# Patient Record
Sex: Male | Born: 1995 | Race: White | Hispanic: No | Marital: Married
Health system: Southern US, Community
[De-identification: ages and names within clinical notes are randomized; demographics above are authoritative.]

## PROBLEM LIST (undated history)

## (undated) DIAGNOSIS — N200 Calculus of kidney: Secondary | ICD-10-CM

---

## 2019-06-26 ENCOUNTER — Emergency Department (HOSPITAL_COMMUNITY)

## 2019-06-26 ENCOUNTER — Emergency Department (HOSPITAL_COMMUNITY)
Admission: EM | Admit: 2019-06-26 | Discharge: 2019-06-27 | Disposition: A | Discharge status: Home / Self Care | Attending: Emergency Medicine | Admitting: Emergency Medicine

## 2019-06-26 ENCOUNTER — Encounter (HOSPITAL_COMMUNITY): Payer: Self-pay | Admitting: Emergency Medicine

## 2019-06-26 ENCOUNTER — Other Ambulatory Visit: Payer: Self-pay

## 2019-06-26 DIAGNOSIS — R1032 Left lower quadrant pain: Secondary | ICD-10-CM | POA: Diagnosis present

## 2019-06-26 DIAGNOSIS — R109 Unspecified abdominal pain: Secondary | ICD-10-CM

## 2019-06-26 DIAGNOSIS — M545 Low back pain: Secondary | ICD-10-CM | POA: Insufficient documentation

## 2019-06-26 LAB — CBC WITH DIFFERENTIAL/PLATELET
Abs Immature Granulocytes: 0.03 10*3/uL (ref 0.00–0.07)
Basophils Absolute: 0 10*3/uL (ref 0.0–0.1)
Basophils Relative: 0 %
Eosinophils Absolute: 0 10*3/uL (ref 0.0–0.5)
Eosinophils Relative: 0 %
HCT: 46 % (ref 39.0–52.0)
Hemoglobin: 15.3 g/dL (ref 13.0–17.0)
Immature Granulocytes: 0 %
Lymphocytes Relative: 11 %
Lymphs Abs: 1.4 10*3/uL (ref 0.7–4.0)
MCH: 30 pg (ref 26.0–34.0)
MCHC: 33.3 g/dL (ref 30.0–36.0)
MCV: 90.2 fL (ref 80.0–100.0)
Monocytes Absolute: 0.5 10*3/uL (ref 0.1–1.0)
Monocytes Relative: 4 %
Neutro Abs: 10.3 10*3/uL — ABNORMAL HIGH (ref 1.7–7.7)
Neutrophils Relative %: 85 %
Platelets: 237 10*3/uL (ref 150–400)
RBC: 5.1 MIL/uL (ref 4.22–5.81)
RDW: 12.2 % (ref 11.5–15.5)
WBC: 12.3 10*3/uL — ABNORMAL HIGH (ref 4.0–10.5)
nRBC: 0 % (ref 0.0–0.2)

## 2019-06-26 LAB — COMPREHENSIVE METABOLIC PANEL
ALT: 23 U/L (ref 0–44)
AST: 23 U/L (ref 15–41)
Albumin: 4.6 g/dL (ref 3.5–5.0)
Alkaline Phosphatase: 51 U/L (ref 38–126)
Anion gap: 11 (ref 5–15)
BUN: 8 mg/dL (ref 6–20)
CO2: 25 mmol/L (ref 22–32)
Calcium: 9.7 mg/dL (ref 8.9–10.3)
Chloride: 103 mmol/L (ref 98–111)
Creatinine, Ser: 0.89 mg/dL (ref 0.61–1.24)
GFR calc Af Amer: >60 mL/min (ref >60)
GFR calc non Af Amer: >60 mL/min (ref >60)
Glucose, Bld: 103 mg/dL — ABNORMAL HIGH (ref 70–99)
Potassium: 3.7 mmol/L (ref 3.5–5.1)
Sodium: 139 mmol/L (ref 135–145)
Total Bilirubin: 0.7 mg/dL (ref 0.3–1.2)
Total Protein: 7.3 g/dL (ref 6.5–8.1)

## 2019-06-26 LAB — URINALYSIS, ROUTINE W REFLEX MICROSCOPIC
Bacteria, UA: NONE SEEN
Bilirubin Urine: NEGATIVE
Glucose, UA: NEGATIVE mg/dL
Ketones, ur: NEGATIVE mg/dL
Leukocytes,Ua: NEGATIVE
Nitrite: NEGATIVE
Protein, ur: NEGATIVE mg/dL
Specific Gravity, Urine: 1.005 (ref 1.005–1.030)
pH: 6 (ref 5.0–8.0)

## 2019-06-26 LAB — I-STAT BETA HCG BLOOD, ED (MC, WL, AP ONLY): I-stat hCG, quantitative: <5 m[IU]/mL (ref <5)

## 2019-06-26 MED ORDER — FENTANYL CITRATE (PF) 100 MCG/2ML IJ SOLN
50.0000 ug | Freq: Once | INTRAMUSCULAR | Status: AC
  Administered 2019-06-26: 21:00:00 50 ug via INTRAVENOUS
  Filled 2019-06-26: qty 2

## 2019-06-26 MED ORDER — ONDANSETRON HCL 4 MG/2ML IJ SOLN
4.0000 mg | Freq: Once | INTRAMUSCULAR | Status: AC
  Administered 2019-06-26: 4 mg via INTRAVENOUS
  Filled 2019-06-26: qty 2

## 2019-06-26 NOTE — ED Triage Notes (Addendum)
Pt was sent from [facility]- to r/o kidney stones. Pt states this morning suddenly got left flank pain that radiates into left groin. Pt also feels like its difficult to urinate.

## 2019-06-27 ENCOUNTER — Emergency Department (HOSPITAL_COMMUNITY)

## 2019-06-27 LAB — WET PREP, GENITAL
Clue Cells Wet Prep HPF POC: NONE SEEN
Sperm: NONE SEEN
Trich, Wet Prep: NONE SEEN
Yeast Wet Prep HPF POC: NONE SEEN

## 2019-06-27 LAB — RPR: RPR Ser Ql: NONREACTIVE

## 2019-06-27 LAB — HIV ANTIBODY (ROUTINE TESTING W REFLEX): HIV Screen 4th Generation wRfx: NONREACTIVE

## 2019-06-27 MED ORDER — ONDANSETRON HCL 4 MG/2ML IJ SOLN
4.0000 mg | Freq: Once | INTRAMUSCULAR | Status: AC
  Administered 2019-06-27: 4 mg via INTRAVENOUS
  Filled 2019-06-27: qty 2

## 2019-06-27 MED ORDER — TAMSULOSIN HCL 0.4 MG PO CAPS: 0.4000 mg | ORAL_CAPSULE | Freq: Every day | ORAL | 0 refills | Status: AC | PRN

## 2019-06-27 MED ORDER — KETOROLAC TROMETHAMINE 30 MG/ML IJ SOLN
15.0000 mg | Freq: Once | INTRAMUSCULAR | Status: AC
  Administered 2019-06-27: 03:00:00 15 mg via INTRAVENOUS
  Filled 2019-06-27: qty 1

## 2019-06-27 MED ORDER — ONDANSETRON 4 MG PO TBDP: 4.0000 mg | ORAL_TABLET | Freq: Three times a day (TID) | ORAL | 0 refills | Status: AC | PRN

## 2019-06-27 MED ORDER — HYDROCODONE-ACETAMINOPHEN 5-325 MG PO TABS: 1.0000 | ORAL_TABLET | Freq: Four times a day (QID) | ORAL | 0 refills | Status: AC | PRN

## 2019-06-27 MED ORDER — OXYCODONE-ACETAMINOPHEN 5-325 MG PO TABS
1.0000 | ORAL_TABLET | Freq: Once | ORAL | Status: AC
  Administered 2019-06-27: 1 via ORAL
  Filled 2019-06-27: qty 1

## 2019-06-27 NOTE — ED Provider Notes (Signed)
[facility] DEPART[facility] Provider Note   CSN: [id number] Arrival date & time: 06/26/19  1530     History   Chief Complaint Chief Complaint  Patient presents with  . Groin Pain    HPI Revere Jasmine Okey Zelek is a 23 y.o. adult presenting for evaluation of left lower flank and left lower quadrant pain.  Patient states acutely around 930 this morning he started to develop pain.  It became severe and constant.  He is reports associated nausea and vomiting.  [facility], encouraged to come to the ER for further evaluation for possible kidney stone.  Patient states approximately 6 hours later, pain lessened and became more manageable.  He has not had any further vomiting.  When pain first began, patient was unable to urinate.  However, since pain has lessened he is urinating frequently, although feels like he is not completely emptying his bladder.  He denies fevers, chills, chest pain, shortness of breath, symptoms on the right side, or normal bowel movements.  No history of kidney stones.  He currently receives hormone treatments every 2 weeks.  He retains male anatomy including uterus and ovaries.  He denies vaginal bleeding or vaginal discharge.  He has no other medical problems, takes no medications daily.     HPI  History reviewed. No pertinent past medical history.  There are no active problems to display for this patient.   History reviewed. No pertinent surgical history.   OB History   No obstetric history on file.      Home Medications    Prior to Admission medications   Medication Sig Start Date End Date Taking? Authorizing Provider  testosterone cypionate (DEPOTESTOSTERONE CYPIONATE) 200 MG/ML injection Inject 100 mg into the muscle every 14 (fourteen) days.   Yes [provider]    Family History No family history on file.  Social History Social History   Tobacco Use  . Smoking status: Not on file  Substance  Use Topics  . Alcohol use: Not on file  . Drug use: Not on file     Allergies   Cinnamon   Review of Systems Review of Systems  Gastrointestinal: Positive for abdominal pain.  Genitourinary: Positive for flank pain.  All other systems reviewed and are negative.    Physical Exam Updated Vital Signs BP 123/75 (BP Location: Right Arm)   Pulse (!) 106   Temp 98.3 F (36.8 C) (Oral)   Resp 18   SpO2 99%   Physical Exam Vitals signs and nursing note reviewed. Exam conducted with a chaperone present.  Constitutional:      General: He is not in acute distress.    Appearance: He is well-developed.     Comments: Appears nontoxic  HENT:     Head: Normocephalic and atraumatic.  Eyes:     Conjunctiva/sclera: Conjunctivae normal.     Pupils: Pupils are equal, round, and reactive to light.  Neck:     Musculoskeletal: Normal range of motion and neck supple.  Cardiovascular:     Rate and Rhythm: Normal rate and regular rhythm.     Pulses: Normal pulses.  Pulmonary:     Effort: Pulmonary effort is normal. No respiratory distress.     Breath sounds: Normal breath sounds. No wheezing.  Abdominal:     General: There is no distension.     Palpations: Abdomen is soft. There is no mass.     Tenderness: There is abdominal tenderness. There  is no guarding or rebound.     Comments: TTP of LLQ abd and L lower back. No CVA tenderness. No TTP elsewhere in the abd.   Genitourinary:    Comments: No obvious discharge. No cmt or obvious adenexal tenderness. no masses or swelling.  Musculoskeletal: Normal range of motion.  Skin:    General: Skin is warm and dry.     Capillary Refill: Capillary refill takes less than 2 seconds.  Neurological:     Mental Status: He is alert and oriented to person, place, and time.      ED Treatments / Results  Labs (all labs ordered are listed, but only abnormal results are displayed) Labs Reviewed  URINALYSIS, ROUTINE W REFLEX MICROSCOPIC - Abnormal;  Notable for the following components:      Result Value   Color, Urine STRAW (*)    Hgb urine dipstick MODERATE (*)    All other components within normal limits  CBC WITH DIFFERENTIAL/PLATELET - Abnormal; Notable for the following components:   WBC 12.3 (*)    Neutro Abs 10.3 (*)    All other components within normal limits  COMPREHENSIVE METABOLIC PANEL - Abnormal; Notable for the following components:   Glucose, Bld 103 (*)    All other components within normal limits  WET PREP, GENITAL  HIV ANTIBODY (ROUTINE TESTING W REFLEX)  RPR  I-STAT BETA HCG BLOOD, ED ([facility], [facility], [facility] ONLY)  GC/CHLAMYDIA PROBE AMP () NOT AT [facility]    EKG None  Radiology Ct Renal Stone Study  Result Date: 06/26/2019 CLINICAL DATA:  Left flank pain, nausea, vomiting EXAM: CT ABDOMEN AND PELVIS WITHOUT CONTRAST TECHNIQUE: Multidetector CT imaging of the abdomen and pelvis was performed following the standard protocol without IV contrast. COMPARISON:  None. FINDINGS: Lower chest: Lung bases are clear. No effusions. Heart is normal size. Hepatobiliary: Insert paddle biliary Pancreas: No focal abnormality or ductal dilatation. Spleen: No focal abnormality.  Normal size. Adrenals/Urinary Tract: Punctate 1 mm stone in the midpole of the right kidney. No ureteral stones or hydronephrosis. No renal or adrenal mass. Urinary bladder unremarkable. Stomach/Bowel: Appendix is normal. Stomach, large and small bowel grossly unremarkable. Vascular/Lymphatic: No evidence of aneurysm or adenopathy. Reproductive: Uterus and adnexa unremarkable.  No mass. Other: No free fluid or free air. Musculoskeletal: No acute bony abnormality. IMPRESSION: Punctate right midpole nephrolithiasis. No ureteral stones or hydronephrosis bilaterally. Normal appendix. No acute findings. Electronically Signed   By: Charlett Nose M.D.   On: 06/26/2019 22:04    Procedures Procedures (including critical care time)  Medications Ordered in ED  Medications  fentaNYL (SUBLIMAZE) injection 50 mcg (50 mcg Intravenous Given 06/26/19 2043)  ondansetron (ZOFRAN) injection 4 mg (4 mg Intravenous Given 06/26/19 2043)     Initial Impression / Assessment and Plan / ED Course  I have reviewed the triage vital signs and the nursing notes.  Pertinent labs & imaging results that were available during my care of the patient were reviewed by me and considered in my medical decision making (see chart for details).        Patient presenting for evaluation of left lower flank, bladder, abdominal pain.  History consistent with likely kidney stone.  However, also consider torsion, TOA, diverticulitis, UTI.  Will obtain urine, labs, and CT renal. fentanyl and Zofran for symptom control.  Labs show slight leukocytosis at 12.3.  Otherwise reassuring.  Urine with blood, but no obvious infection.  CT renal without ureterolithiasis.  Patient symptoms could be due  to a recently passed stone.  However, as scan was negative, I believe further work-up needs to be done to rule out GU emergency. Pelvic exam performed, no obvious discharge. Will order pelvic US to r/o torsion. if negative, will tx sxs for presumed recently passed kidney stone.   Pt signed out to Dorise BullionE Hammond, PA-C for f/u on US.   Final Clinical Impressions(s) / ED Diagnoses   Final diagnoses:  None    ED Discharge Orders    None       Alveria ApleyCaccavale, Ryanna Teschner, PA-C 06/27/19 0029    Little, Ambrose Finlandachel Morgan, MD 06/27/19 [phone]

## 2019-06-27 NOTE — Discharge Instructions (Addendum)
Take flomax daily.  Make sure you are staying well hydrated with water. Your urine should be clear to pale yellow.  Take ibuprofen 3 times a day with meals.  Do not take other anti-inflammatories at the same time (Advil, Motrin, naproxen, Aleve). You may supplement with Tylenol if you need further pain control. Use norco as needed for severe or break through pain. Have caution, this may make you tired or groggy. Do not drive or operate heavy machinery while taking this medication.  Follow up with urology if pain is not improving.  Return to the ER if you develop high fevers, you are unable to urinate, or with any new, worsening, or concerning symptoms.

## 2019-06-27 NOTE — ED Notes (Signed)
Patient transported to Ultrasound 

## 2019-06-27 NOTE — ED Provider Notes (Signed)
Dewar presents today for evaluation of left lower flank and lower quadrant pain.  Please see note by previous team for full H&P.    Physical Exam  BP 123/75 (BP Location: Right Arm)   Pulse (!) 106   Temp 98.3 F (36.8 C) (Oral)   Resp 18   SpO2 99%   Physical Exam Vitals signs and nursing note reviewed.  Constitutional:      General: He is not in acute distress. HENT:     Mouth/Throat:     Mouth: Mucous membranes are moist.  Pulmonary:     Effort: Pulmonary effort is normal. No respiratory distress.  Abdominal:     General: There is no distension.     Tenderness: There is no abdominal tenderness.  Neurological:     General: No focal deficit present.     Mental Status: He is alert.  Psychiatric:        Mood and Affect: Mood normal.        Behavior: Behavior normal.     ED Course/Procedures     Procedures   US Transvaginal Non-ob  Result Date: 06/27/2019 CLINICAL DATA:  Pelvic pain with concern for torsion EXAM: TRANSABDOMINAL AND TRANSVAGINAL ULTRASOUND OF PELVIS DOPPLER ULTRASOUND OF OVARIES TECHNIQUE: Both transabdominal and transvaginal ultrasound examinations of the pelvis were performed. Transabdominal technique was performed for global imaging of the pelvis including uterus, ovaries, adnexal regions, and pelvic cul-de-sac. It was necessary to proceed with endovaginal exam following the transabdominal exam to visualize the uterus and ovaries. Color and duplex Doppler ultrasound was utilized to evaluate blood flow to the ovaries. COMPARISON:  CT abdomen pelvis 06/26/2019 FINDINGS: Uterus Measurements: 6.6 x 3.0 x 3.8 cm = volume: 40 mL. No fibroids or other mass visualized. Endometrium Thickness: 8 mm.  No focal abnormality visualized. Right ovary Measurements: 2.5 x 1.6 x 1.3 cm = volume: 3.1 mL. Normal appearance/no adnexal mass. Left ovary Measurements: 1.5 x 1.3 x 1.0 cm = volume: 1.1 mL. Normal appearance/no adnexal mass. Pulsed Doppler evaluation of  both ovaries demonstrates normal low-resistance arterial and venous waveforms. Other findings No abnormal free fluid. IMPRESSION: Normal pelvic ultrasound and Doppler examination. Electronically Signed   By: Ulyses Jarred M.D.   On: 06/27/2019 01:38   US Pelvis Complete  Result Date: 06/27/2019 CLINICAL DATA:  Pelvic pain with concern for torsion EXAM: TRANSABDOMINAL AND TRANSVAGINAL ULTRASOUND OF PELVIS DOPPLER ULTRASOUND OF OVARIES TECHNIQUE: Both transabdominal and transvaginal ultrasound examinations of the pelvis were performed. Transabdominal technique was performed for global imaging of the pelvis including uterus, ovaries, adnexal regions, and pelvic cul-de-sac. It was necessary to proceed with endovaginal exam following the transabdominal exam to visualize the uterus and ovaries. Color and duplex Doppler ultrasound was utilized to evaluate blood flow to the ovaries. COMPARISON:  CT abdomen pelvis 06/26/2019 FINDINGS: Uterus Measurements: 6.6 x 3.0 x 3.8 cm = volume: 40 mL. No fibroids or other mass visualized. Endometrium Thickness: 8 mm.  No focal abnormality visualized. Right ovary Measurements: 2.5 x 1.6 x 1.3 cm = volume: 3.1 mL. Normal appearance/no adnexal mass. Left ovary Measurements: 1.5 x 1.3 x 1.0 cm = volume: 1.1 mL. Normal appearance/no adnexal mass. Pulsed Doppler evaluation of both ovaries demonstrates normal low-resistance arterial and venous waveforms. Other findings No abnormal free fluid. IMPRESSION: Normal pelvic ultrasound and Doppler examination. Electronically Signed   By: Ulyses Jarred M.D.   On: 06/27/2019 01:38   [place] Art/ven Flow Abd Pelv Doppler  Result Date: 06/27/2019 CLINICAL  DATA:  Pelvic pain with concern for torsion EXAM: TRANSABDOMINAL AND TRANSVAGINAL ULTRASOUND OF PELVIS DOPPLER ULTRASOUND OF OVARIES TECHNIQUE: Both transabdominal and transvaginal ultrasound examinations of the pelvis were performed. Transabdominal technique was performed for global imaging of the  pelvis including uterus, ovaries, adnexal regions, and pelvic cul-de-sac. It was necessary to proceed with endovaginal exam following the transabdominal exam to visualize the uterus and ovaries. Color and duplex Doppler ultrasound was utilized to evaluate blood flow to the ovaries. COMPARISON:  CT abdomen pelvis 06/26/2019 FINDINGS: Uterus Measurements: 6.6 x 3.0 x 3.8 cm = volume: 40 mL. No fibroids or other mass visualized. Endometrium Thickness: 8 mm.  No focal abnormality visualized. Right ovary Measurements: 2.5 x 1.6 x 1.3 cm = volume: 3.1 mL. Normal appearance/no adnexal mass. Left ovary Measurements: 1.5 x 1.3 x 1.0 cm = volume: 1.1 mL. Normal appearance/no adnexal mass. Pulsed Doppler evaluation of both ovaries demonstrates normal low-resistance arterial and venous waveforms. Other findings No abnormal free fluid. IMPRESSION: Normal pelvic ultrasound and Doppler examination. Electronically Signed   By: Deatra RobinsonKevin  Herman M.D.   On: 06/27/2019 01:38   Ct Renal Stone Study  Result Date: 06/26/2019 CLINICAL DATA:  Left flank pain, nausea, vomiting EXAM: CT ABDOMEN AND PELVIS WITHOUT CONTRAST TECHNIQUE: Multidetector CT imaging of the abdomen and pelvis was performed following the standard protocol without IV contrast. COMPARISON:  None. FINDINGS: Lower chest: Lung bases are clear. No effusions. Heart is normal size. Hepatobiliary: Insert paddle biliary Pancreas: No focal abnormality or ductal dilatation. Spleen: No focal abnormality.  Normal size. Adrenals/Urinary Tract: Punctate 1 mm stone in the midpole of the right kidney. No ureteral stones or hydronephrosis. No renal or adrenal mass. Urinary bladder unremarkable. Stomach/Bowel: Appendix is normal. Stomach, large and small bowel grossly unremarkable. Vascular/Lymphatic: No evidence of aneurysm or adenopathy. Reproductive: Uterus and adnexa unremarkable.  No mass. Other: No free fluid or free air. Musculoskeletal: No acute bony abnormality. IMPRESSION:  Punctate right midpole nephrolithiasis. No ureteral stones or hydronephrosis bilaterally. Normal appendix. No acute findings. Electronically Signed   By: Charlett NoseKevin  Dover M.D.   On: 06/26/2019 22:04    Labs Reviewed  WET PREP, GENITAL - Abnormal; Notable for the following components:      Result Value   WBC, Wet Prep HPF POC FEW (*)    All other components within normal limits  URINALYSIS, ROUTINE W REFLEX MICROSCOPIC - Abnormal; Notable for the following components:   Color, Urine STRAW (*)    Hgb urine dipstick MODERATE (*)    All other components within normal limits  CBC WITH DIFFERENTIAL/PLATELET - Abnormal; Notable for the following components:   WBC 12.3 (*)    Neutro Abs 10.3 (*)    All other components within normal limits  COMPREHENSIVE METABOLIC PANEL - Abnormal; Notable for the following components:   Glucose, Bld 103 (*)    All other components within normal limits  HIV ANTIBODY (ROUTINE TESTING W REFLEX)  RPR  I-STAT BETA HCG BLOOD, ED ([facility], [facility], [facility] ONLY)  GC/CHLAMYDIA PROBE AMP ([place] NOT AT [facility]     MDM  Briefly plan is to follow-up on ultrasound.  If normal will treat as a recently asked kidney stone and discharge.  Ultrasound results reviewed, are reassuring.  Patient was reevaluated and his pain is significantly improved.  He states that he is ready to go home.  [place] PMP database was consulted, he is given prescriptions for Norco, Zofran, and Flomax.  Return precautions were discussed with patient who  states their understanding.  At the time of discharge patient denied any unaddressed complaints or concerns.  Patient is agreeable for discharge home.        Cristina Gong, PA-C 06/27/19 0249    Geoffery Lyons, MD 06/27/19 [phone]

## 2019-06-28 LAB — GC/CHLAMYDIA PROBE AMP (~~LOC~~) NOT AT ARMC
Chlamydia: NEGATIVE
Neisseria Gonorrhea: NEGATIVE

## 2020-10-06 ENCOUNTER — Emergency Department (HOSPITAL_COMMUNITY)
Admission: EM | Admit: 2020-10-06 | Discharge: 2020-10-07 | Disposition: A | Discharge status: Home / Self Care | Attending: Emergency Medicine | Admitting: Emergency Medicine

## 2020-10-06 ENCOUNTER — Emergency Department (HOSPITAL_COMMUNITY)

## 2020-10-06 ENCOUNTER — Encounter (HOSPITAL_COMMUNITY): Payer: Self-pay

## 2020-10-06 DIAGNOSIS — R42 Dizziness and giddiness: Secondary | ICD-10-CM | POA: Diagnosis not present

## 2020-10-06 DIAGNOSIS — R110 Nausea: Secondary | ICD-10-CM | POA: Insufficient documentation

## 2020-10-06 DIAGNOSIS — R079 Chest pain, unspecified: Secondary | ICD-10-CM | POA: Insufficient documentation

## 2020-10-06 DIAGNOSIS — Z5321 Procedure and treatment not carried out due to patient leaving prior to being seen by health care provider: Secondary | ICD-10-CM | POA: Insufficient documentation

## 2020-10-06 DIAGNOSIS — R0602 Shortness of breath: Secondary | ICD-10-CM | POA: Diagnosis not present

## 2020-10-06 HISTORY — DX: Calculus of kidney: N20.0

## 2020-10-06 LAB — CBC
HCT: 45.8 % (ref 39.0–52.0)
Hemoglobin: 15.1 g/dL (ref 13.0–17.0)
MCH: 29.5 pg (ref 26.0–34.0)
MCHC: 33 g/dL (ref 30.0–36.0)
MCV: 89.6 fL (ref 80.0–100.0)
Platelets: 224 10*3/uL (ref 150–400)
RBC: 5.11 MIL/uL (ref 4.22–5.81)
RDW: 11.9 % (ref 11.5–15.5)
WBC: 12.7 10*3/uL — ABNORMAL HIGH (ref 4.0–10.5)
nRBC: 0 % (ref 0.0–0.2)

## 2020-10-06 LAB — BASIC METABOLIC PANEL
Anion gap: 11 (ref 5–15)
BUN: 9 mg/dL (ref 6–20)
CO2: 27 mmol/L (ref 22–32)
Calcium: 9.7 mg/dL (ref 8.9–10.3)
Chloride: 101 mmol/L (ref 98–111)
Creatinine, Ser: 0.93 mg/dL (ref 0.61–1.24)
GFR, Estimated: >60 mL/min (ref >60)
Glucose, Bld: 113 mg/dL — ABNORMAL HIGH (ref 70–99)
Potassium: 3.6 mmol/L (ref 3.5–5.1)
Sodium: 139 mmol/L (ref 135–145)

## 2020-10-06 LAB — TROPONIN I (HIGH SENSITIVITY): Troponin I (High Sensitivity): <2 ng/L (ref <18)

## 2020-10-06 NOTE — ED Triage Notes (Signed)
Pt reports that he has been having CP that started about an hour ago, L sided, some SOB, some nausea, dizziness

## 2020-10-07 LAB — TROPONIN I (HIGH SENSITIVITY): Troponin I (High Sensitivity): <2 ng/L (ref <18)

## 2020-10-07 NOTE — ED Notes (Signed)
Pt stated he would just go to urgent care in the morning, has to drive to [facility], pt left

## 2021-07-29 IMAGING — CR DG CHEST 2V
2 series · 2 of 2 positions shown · non-contrast
Comparison: None.

CLINICAL DATA: Chest pain

EXAM:
CHEST - 2 VIEW

[chest pa]
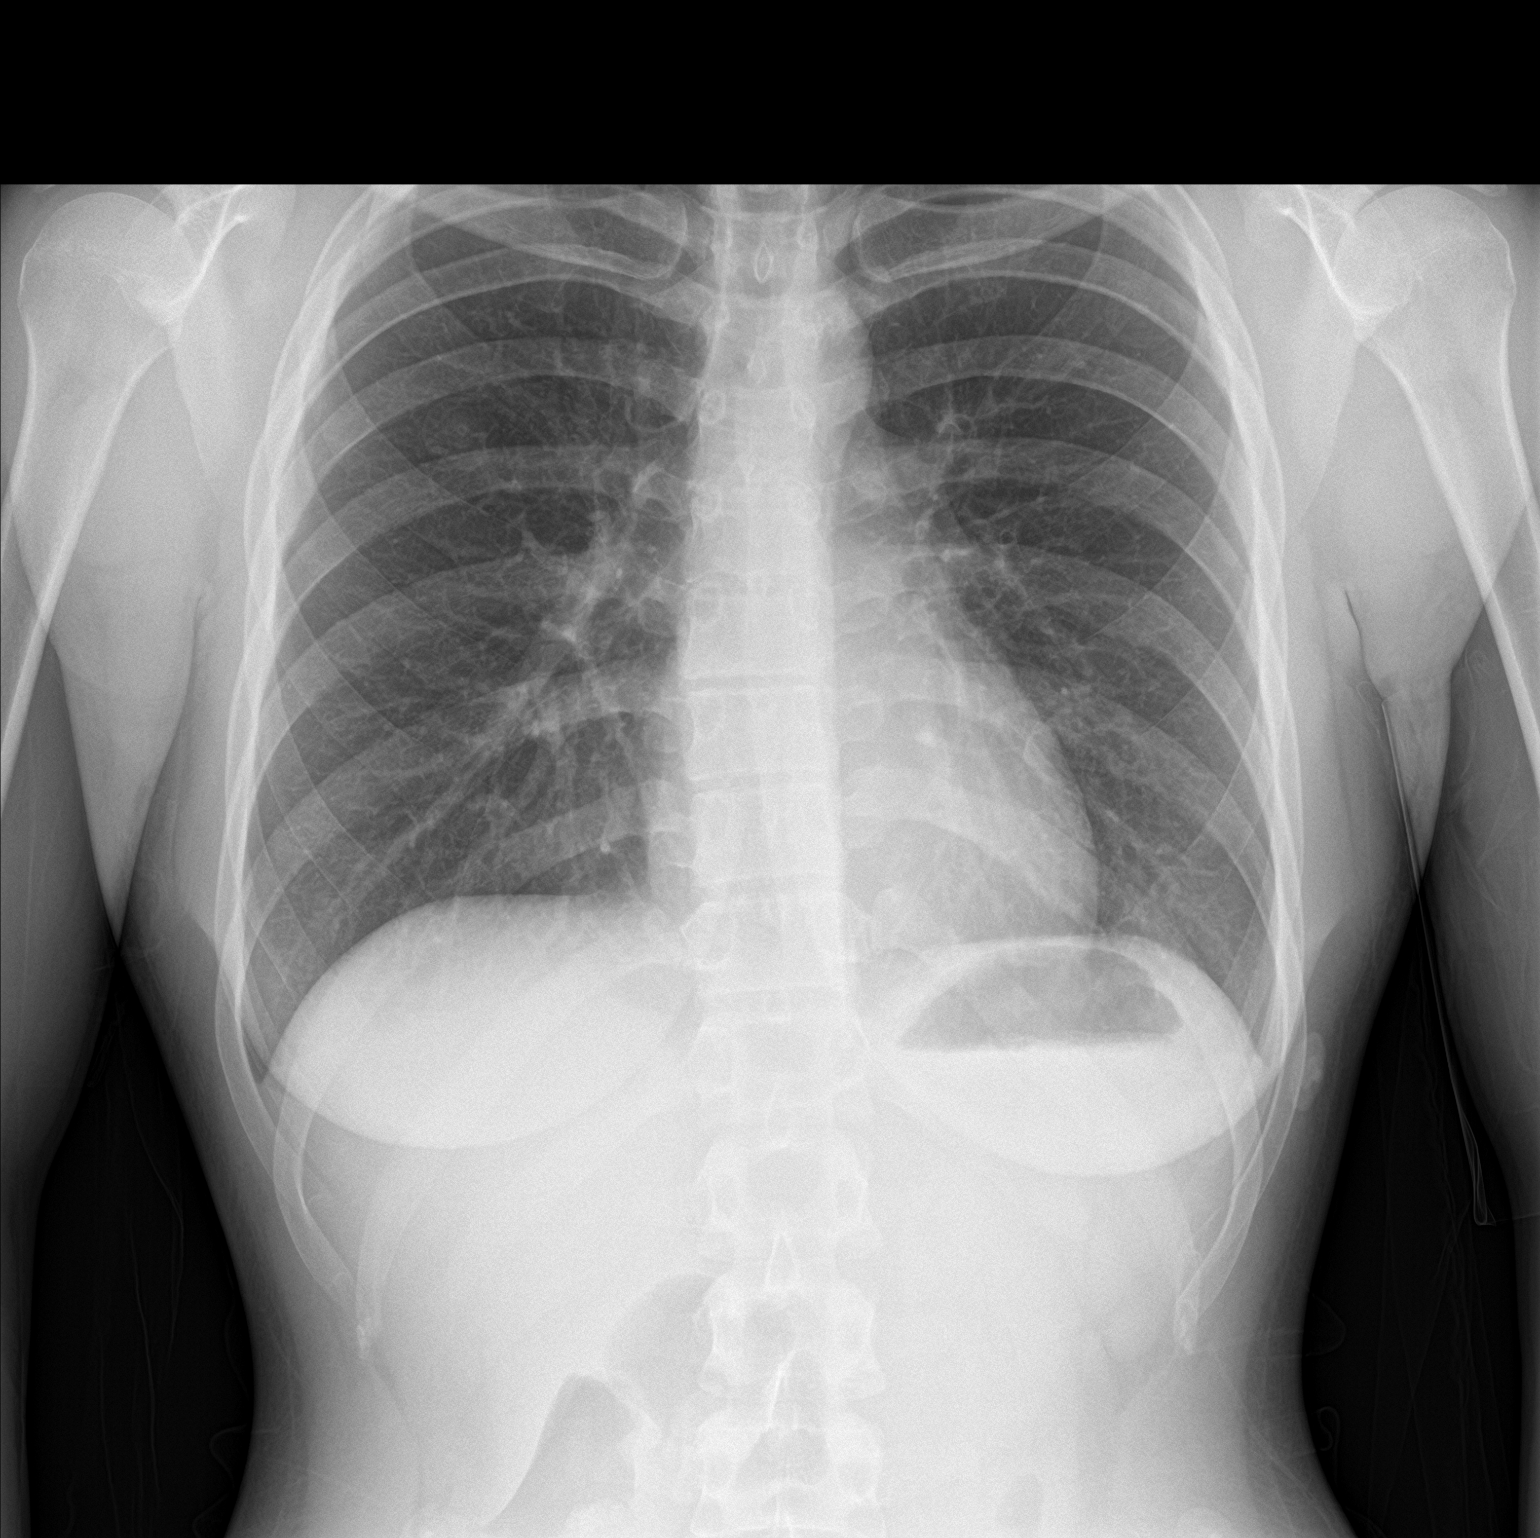

[chest lat]
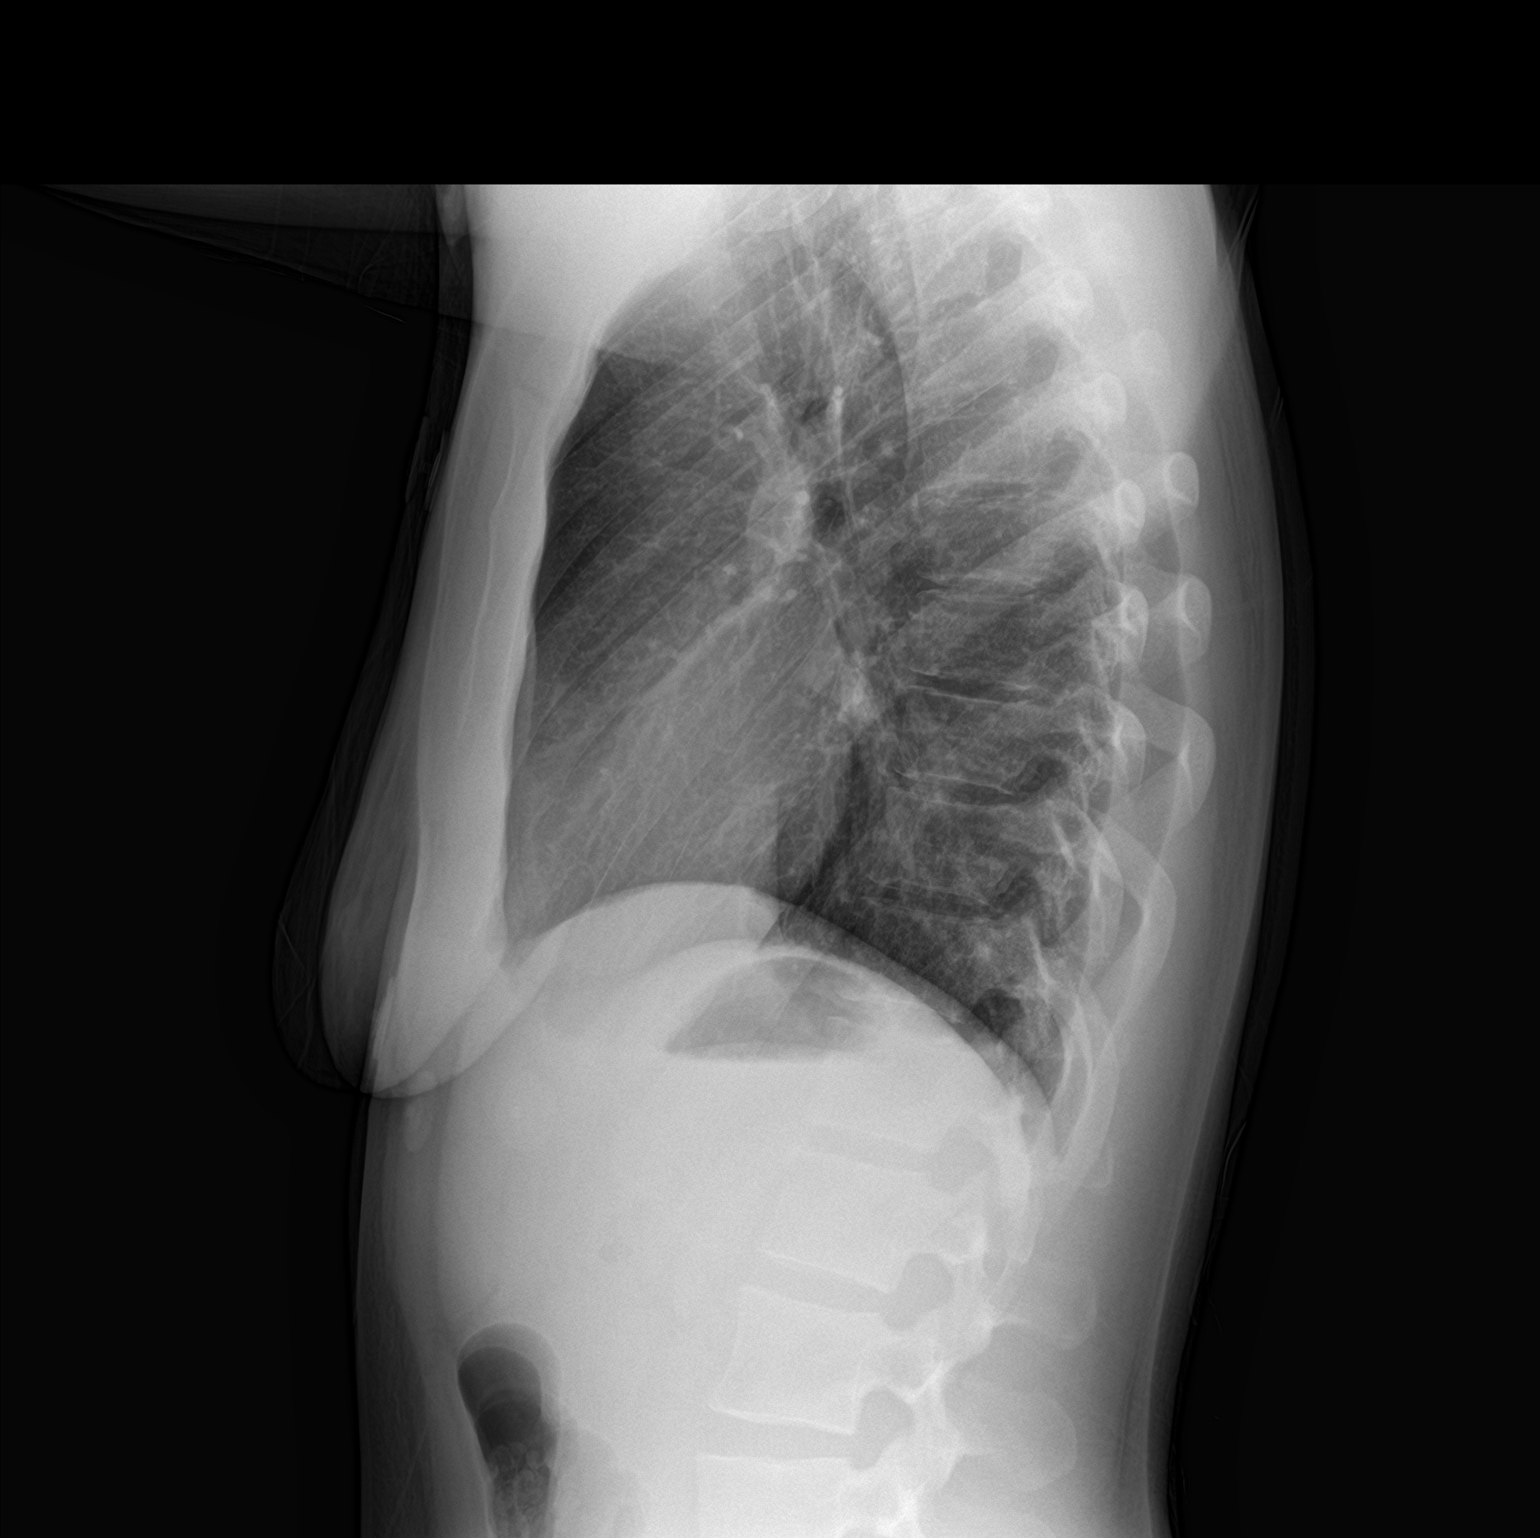

[2 of 2 positions shown; findings below may reference images not displayed]

FINDINGS: The heart size and mediastinal contours are within normal limits.
Both lungs are clear. The visualized skeletal structures are
unremarkable.
IMPRESSION: No active cardiopulmonary disease.
